# Patient Record
Sex: Female | Born: 1975 | Race: Black or African American | Hispanic: No | Marital: Single | Smoking: Never smoker
Health system: Southern US, Community
[De-identification: ages and names within clinical notes are randomized; demographics above are authoritative.]

## PROBLEM LIST (undated history)

## (undated) DIAGNOSIS — G4730 Sleep apnea, unspecified: Secondary | ICD-10-CM

## (undated) DIAGNOSIS — M1990 Unspecified osteoarthritis, unspecified site: Secondary | ICD-10-CM

## (undated) DIAGNOSIS — D649 Anemia, unspecified: Secondary | ICD-10-CM

## (undated) DIAGNOSIS — K219 Gastro-esophageal reflux disease without esophagitis: Secondary | ICD-10-CM

## (undated) HISTORY — DX: Unspecified osteoarthritis, unspecified site: M19.90

## (undated) HISTORY — DX: Gastro-esophageal reflux disease without esophagitis: K21.9

## (undated) HISTORY — PX: BUNIONECTOMY: SHX129

## (undated) HISTORY — PX: LAPAROSCOPIC GASTRIC BANDING: SHX1100

## (undated) HISTORY — DX: Anemia, unspecified: D64.9

## (undated) HISTORY — DX: Sleep apnea, unspecified: G47.30

---

## 2009-06-23 ENCOUNTER — Ambulatory Visit (HOSPITAL_COMMUNITY): Admission: RE | Admit: 2009-06-23 | Discharge: 2009-06-23 | Payer: Self-pay | Admitting: Surgery

## 2009-06-30 ENCOUNTER — Ambulatory Visit (HOSPITAL_COMMUNITY): Admission: RE | Admit: 2009-06-30 | Discharge: 2009-06-30 | Payer: Self-pay | Admitting: Surgery

## 2009-07-01 ENCOUNTER — Ambulatory Visit (HOSPITAL_BASED_OUTPATIENT_CLINIC_OR_DEPARTMENT_OTHER): Admission: RE | Admit: 2009-07-01 | Discharge: 2009-07-01 | Payer: Self-pay | Admitting: Surgery

## 2009-07-04 ENCOUNTER — Ambulatory Visit: Payer: Self-pay | Admitting: Internal Medicine

## 2009-07-04 DIAGNOSIS — G4730 Sleep apnea, unspecified: Secondary | ICD-10-CM

## 2009-07-04 HISTORY — DX: Sleep apnea, unspecified: G47.30

## 2009-08-03 ENCOUNTER — Encounter: Admission: RE | Admit: 2009-08-03 | Discharge: 2009-11-01 | Payer: Self-pay | Admitting: Surgery

## 2009-08-26 ENCOUNTER — Encounter: Payer: Self-pay | Admitting: Internal Medicine

## 2009-10-01 ENCOUNTER — Ambulatory Visit (HOSPITAL_COMMUNITY): Admission: RE | Admit: 2009-10-01 | Discharge: 2009-10-01 | Payer: Self-pay | Admitting: Surgery

## 2009-11-17 ENCOUNTER — Ambulatory Visit (HOSPITAL_COMMUNITY): Admission: RE | Admit: 2009-11-17 | Discharge: 2009-11-18 | Payer: Self-pay | Admitting: Surgery

## 2009-11-23 ENCOUNTER — Ambulatory Visit (HOSPITAL_COMMUNITY): Admission: RE | Admit: 2009-11-23 | Discharge: 2009-11-23 | Payer: Self-pay | Admitting: Surgery

## 2009-12-01 ENCOUNTER — Encounter: Admission: RE | Admit: 2009-12-01 | Discharge: 2010-03-01 | Payer: Self-pay | Admitting: Surgery

## 2010-01-25 ENCOUNTER — Other Ambulatory Visit: Admission: RE | Admit: 2010-01-25 | Discharge: 2010-01-25 | Payer: Self-pay | Admitting: Family Medicine

## 2010-07-12 ENCOUNTER — Encounter
Admission: RE | Admit: 2010-07-12 | Discharge: 2010-08-03 | Payer: Self-pay | Source: Home / Self Care | Attending: Surgery | Admitting: Surgery

## 2010-07-25 ENCOUNTER — Encounter: Payer: Self-pay | Admitting: Surgery

## 2010-08-03 NOTE — Miscellaneous (Signed)
Summary: PAP Device follow up report/[facility]  PAP Device follow up report/[facility]   Imported By: Sherian Rein 09/11/2009 13:12:51  _____________________________________________________________________  External Attachment:    Type:   Image     Comment:   External Document

## 2010-09-20 LAB — CBC
HCT: 36 % (ref 36.0–46.0)
MCHC: 32 g/dL (ref 30.0–36.0)
RBC: 4.12 MIL/uL (ref 3.87–5.11)

## 2010-09-20 LAB — DIFFERENTIAL
Basophils Relative: 0 % (ref 0–1)
Lymphocytes Relative: 5 % — ABNORMAL LOW (ref 12–46)
Lymphs Abs: 0.7 10*3/uL (ref 0.7–4.0)
Monocytes Absolute: 0.5 10*3/uL (ref 0.1–1.0)
Monocytes Relative: 4 % (ref 3–12)
Neutro Abs: 12.2 10*3/uL — ABNORMAL HIGH (ref 1.7–7.7)
Neutrophils Relative %: 91 % — ABNORMAL HIGH (ref 43–77)

## 2010-09-20 LAB — HEMOGLOBIN AND HEMATOCRIT, BLOOD: HCT: 38.2 % (ref 36.0–46.0)

## 2010-09-21 ENCOUNTER — Encounter: Payer: BC Managed Care – PPO | Attending: Surgery | Admitting: *Deleted

## 2010-09-21 DIAGNOSIS — Z713 Dietary counseling and surveillance: Secondary | ICD-10-CM | POA: Insufficient documentation

## 2010-09-21 DIAGNOSIS — Z9884 Bariatric surgery status: Secondary | ICD-10-CM | POA: Insufficient documentation

## 2010-09-21 DIAGNOSIS — Z09 Encounter for follow-up examination after completed treatment for conditions other than malignant neoplasm: Secondary | ICD-10-CM | POA: Insufficient documentation

## 2010-09-21 LAB — COMPREHENSIVE METABOLIC PANEL
AST: 33 U/L (ref 0–37)
CO2: 28 mEq/L (ref 19–32)
Calcium: 9.2 mg/dL (ref 8.4–10.5)
Chloride: 103 mEq/L (ref 96–112)
GFR calc Af Amer: >60 mL/min (ref >60)
Sodium: 139 mEq/L (ref 135–145)
Total Bilirubin: 0.5 mg/dL (ref 0.3–1.2)
Total Protein: 7.9 g/dL (ref 6.0–8.3)

## 2010-09-21 LAB — CBC
HCT: 36 % (ref 36.0–46.0)
Platelets: 280 10*3/uL (ref 150–400)
RBC: 4.21 MIL/uL (ref 3.87–5.11)
RDW: 14.4 % (ref 11.5–15.5)

## 2010-09-21 LAB — DIFFERENTIAL
Basophils Relative: 2 % — ABNORMAL HIGH (ref 0–1)
Lymphocytes Relative: 22 % (ref 12–46)
Monocytes Absolute: 0.8 10*3/uL (ref 0.1–1.0)

## 2010-11-18 ENCOUNTER — Ambulatory Visit: Payer: BC Managed Care – PPO | Admitting: *Deleted

## 2010-11-24 ENCOUNTER — Encounter: Payer: BC Managed Care – PPO | Attending: Surgery | Admitting: *Deleted

## 2010-11-24 DIAGNOSIS — Z9884 Bariatric surgery status: Secondary | ICD-10-CM | POA: Insufficient documentation

## 2010-11-24 DIAGNOSIS — Z713 Dietary counseling and surveillance: Secondary | ICD-10-CM | POA: Insufficient documentation

## 2010-11-24 DIAGNOSIS — Z09 Encounter for follow-up examination after completed treatment for conditions other than malignant neoplasm: Secondary | ICD-10-CM | POA: Insufficient documentation

## 2011-02-01 ENCOUNTER — Ambulatory Visit: Payer: BC Managed Care – PPO | Admitting: *Deleted

## 2011-06-03 ENCOUNTER — Ambulatory Visit (INDEPENDENT_AMBULATORY_CARE_PROVIDER_SITE_OTHER): Payer: BC Managed Care – PPO | Admitting: Surgery

## 2011-06-10 ENCOUNTER — Ambulatory Visit (INDEPENDENT_AMBULATORY_CARE_PROVIDER_SITE_OTHER): Payer: BC Managed Care – PPO | Admitting: Surgery

## 2011-06-10 ENCOUNTER — Encounter (INDEPENDENT_AMBULATORY_CARE_PROVIDER_SITE_OTHER): Payer: Self-pay | Admitting: Surgery

## 2011-06-10 VITALS — BP 106/68 | HR 60 | Temp 97.8°F | Resp 18 | Ht 71.75 in | Wt 335.0 lb

## 2011-06-10 DIAGNOSIS — Z9884 Bariatric surgery status: Secondary | ICD-10-CM

## 2011-06-10 DIAGNOSIS — Z4651 Encounter for fitting and adjustment of gastric lap band: Secondary | ICD-10-CM

## 2011-06-10 NOTE — Patient Instructions (Signed)

## 2011-06-10 NOTE — Progress Notes (Signed)
19 months postop with a weight of 335 she has lost 106 pounds. She may be removing to [facility].today I added one cc to APL Band. She was able to drink liquids without problems.  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 She is seeing Dr. Cyndia Skeeters.    Plan return in 2 months

## 2011-08-26 ENCOUNTER — Ambulatory Visit (INDEPENDENT_AMBULATORY_CARE_PROVIDER_SITE_OTHER): Payer: BC Managed Care – PPO | Admitting: Surgery

## 2011-08-26 DIAGNOSIS — Z9884 Bariatric surgery status: Secondary | ICD-10-CM

## 2011-08-26 NOTE — Progress Notes (Signed)
Kayla Russo 36 y.o.  There is no height or weight on file to calculate BMI.  Patient Active Problem List  Diagnoses  . 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 Amoxicillin     Rash around mouth     Past Surgical History  Procedure Date  . Laparoscopic gastric banding may 17th, 2011  . Bunionectomy 2007/2008   [name], MD, MD No diagnosis found.  Ever since the 06/10/11  fill when added 1 cc to her band Kayla has had reflux at night. She was clearly too tight. I went ahead and removed 0.5 cc from her band. She was able to drink much easier than. I'll see her back in 3 months. She is still contemplating moving to [facility]. Matt B. Daphine Deutscher, MD, [facility] [phone] beeper [phone]  08/26/2011 5:15 PM

## 2011-10-28 ENCOUNTER — Ambulatory Visit (INDEPENDENT_AMBULATORY_CARE_PROVIDER_SITE_OTHER): Payer: BC Managed Care – PPO | Admitting: Surgery

## 2011-10-28 ENCOUNTER — Encounter (INDEPENDENT_AMBULATORY_CARE_PROVIDER_SITE_OTHER): Payer: Self-pay | Admitting: Surgery

## 2011-10-28 VITALS — BP 122/80 | HR 76 | Temp 95.8°F | Resp 16 | Ht 71.75 in | Wt 340.8 lb

## 2011-10-28 DIAGNOSIS — Z9884 Bariatric surgery status: Secondary | ICD-10-CM

## 2011-10-28 NOTE — Progress Notes (Signed)
[place] comes in today and she is almost 2 years out from her lapband APL with repair of hiatal hernia. She has recently developed some acid reflux issues. She is currently down to 340.8 pounds me she lost 36% of her excess weight or 100.4 pounds.  She is currently in the midst of some conflict and tried to decide whether she can move to [place] or not. There may be some issues with her partner as well. We had long discussion about trying to get back on track and staying on the low carbohydrate diet. Also try to increase her activity level. She would like to be below 300 pounds.  I don't think she needs a fill today. I will see her back in 6-8 weeks and we'll discuss how she is doing.

## 2011-12-23 ENCOUNTER — Encounter (INDEPENDENT_AMBULATORY_CARE_PROVIDER_SITE_OTHER): Payer: BC Managed Care – PPO | Admitting: Surgery

## 2011-12-30 ENCOUNTER — Encounter (INDEPENDENT_AMBULATORY_CARE_PROVIDER_SITE_OTHER): Payer: Self-pay | Admitting: Surgery

## 2011-12-30 ENCOUNTER — Ambulatory Visit (INDEPENDENT_AMBULATORY_CARE_PROVIDER_SITE_OTHER): Payer: BC Managed Care – PPO | Admitting: Surgery

## 2011-12-30 VITALS — Ht 71.75 in | Wt 351.1 lb

## 2011-12-30 DIAGNOSIS — K219 Gastro-esophageal reflux disease without esophagitis: Secondary | ICD-10-CM

## 2011-12-30 NOTE — Progress Notes (Signed)
Kayla Russo 36 y.o.  Body mass index is 47.95 kg/(m^2).  Patient Active Problem List  Diagnosis  . 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 Amoxicillin     Rash around mouth     Past Surgical History  Procedure Date  . Laparoscopic gastric banding may 17th, 2011  . Bunionectomy 2007/2008   [name], MD No diagnosis found.  Kayla has gained 20 pounds since February and 11 pounds since I saw her in April. She had 5/10 of a cc removed in February because she was having reflux. I did a APL with one stitch posterior hiatal hernia closure and now it sounds like she may be failing that repair. Before we do anything along to get an upper GI series to look at her anatomy. She will not be moving to [facility] she will be around for [place] to care for. Will obtain the upper GI and see her back in the office. Matt B. Daphine Deutscher, MD, [facility] [phone] beeper [phone]  12/30/2011 10:09 AM

## 2011-12-30 NOTE — Patient Instructions (Addendum)
Have upper GI series and then followup with our office

## 2012-01-04 ENCOUNTER — Ambulatory Visit
Admission: RE | Admit: 2012-01-04 | Discharge: 2012-01-04 | Disposition: A | Discharge status: Home / Self Care | Payer: BC Managed Care – PPO | Source: Ambulatory Visit | Attending: Surgery | Admitting: Surgery

## 2012-01-04 ENCOUNTER — Encounter (INDEPENDENT_AMBULATORY_CARE_PROVIDER_SITE_OTHER): Payer: Self-pay | Admitting: Surgery

## 2012-01-04 ENCOUNTER — Other Ambulatory Visit (INDEPENDENT_AMBULATORY_CARE_PROVIDER_SITE_OTHER): Payer: Self-pay | Admitting: Surgery

## 2012-01-04 ENCOUNTER — Ambulatory Visit (INDEPENDENT_AMBULATORY_CARE_PROVIDER_SITE_OTHER): Payer: BC Managed Care – PPO | Admitting: Surgery

## 2012-01-04 VITALS — BP 142/78 | HR 68 | Temp 97.7°F | Resp 16 | Ht 72.0 in | Wt 350.2 lb

## 2012-01-04 DIAGNOSIS — K219 Gastro-esophageal reflux disease without esophagitis: Secondary | ICD-10-CM

## 2012-01-04 DIAGNOSIS — Z9884 Bariatric surgery status: Secondary | ICD-10-CM

## 2012-01-04 NOTE — Progress Notes (Signed)
[name] 36 y.o.  Body mass index is 47.50 kg/(m^2).  Patient Active Problem List  Diagnosis  . 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 Amoxicillin     Rash around mouth     Past Surgical History  Procedure Date  . Laparoscopic gastric banding may 17th, 2011  . Bunionectomy 2007/2008   [name], MD No diagnosis found.  UGI series reviewed.  Band in good position but only a trickle of contrast goes through the band.  Too tight.  0.5 cc removed.  I met with her and her partner and discuss the things to watch for her diet. We discussed a low carb diet as I suspect she's been developing maladaptive eating. Hopefully she will tolerate more than good things. I will see her again in August. Will see back in 4-5 weeks  Matt B. Daphine Deutscher, MD, [facility] [phone] beeper [phone]  01/04/2012 5:54 PM

## 2012-01-04 NOTE — Patient Instructions (Addendum)

## 2012-01-20 ENCOUNTER — Encounter: Payer: BC Managed Care – PPO | Attending: Surgery | Admitting: *Deleted

## 2012-01-20 DIAGNOSIS — Z713 Dietary counseling and surveillance: Secondary | ICD-10-CM | POA: Insufficient documentation

## 2012-01-20 DIAGNOSIS — Z9884 Bariatric surgery status: Secondary | ICD-10-CM | POA: Insufficient documentation

## 2012-01-20 DIAGNOSIS — Z09 Encounter for follow-up examination after completed treatment for conditions other than malignant neoplasm: Secondary | ICD-10-CM | POA: Insufficient documentation

## 2012-01-20 NOTE — Progress Notes (Signed)
  Follow-up visit:  27 Months Post-Operative LAGB Surgery  Medical Nutrition Therapy:  Appt start time: 0815 end time:  0900.  Primary concerns today:  Post-operative Bariatric Surgery Nutrition Management. Kayla Russo today for f/u after hiatus from [facility]: [name] Reports ABW of ~320-325 lbs until January 2013, when she started gaining wt d/t increased stress; currently 352.7 lbs (gain of 27.7 lbs in last 7 mos). Food recall reveals excessive CHO intake and she has stopped exercising. Had 0.5 cc removed from band on 01/04/12 and states she is in the "green" zone, though needs help making better food choices. Discussed getting back on track with only 15g of CHO per meal/snack and resuming exercise.     Surgery date: 11/17/09 Start weight at [facility]: 445.2 lbs (per pt)  Weight today: 352.7 lbs (reports lowest wt of 318 lbs in 07/2011 & ABW of 320-325 lbs) Weight change: n/a Total weight lost: 92.5 lbs BMI: 50.6 kg/m^2  24-hr recall: B (AM): reg sausage - 1 pc, 2 eggs w/ cheese, pancakes - 2 @ 4" ea Snk (AM): [place] sandwich w/ mayo & cheese  L (PM): None Snk (PM): Chips OR none  D (9-10 PM): 1/2 Chipotle chicken bowl, brown rice, ice cream sandwich  Snk (PM): None  Fluid intake: Water (sometimes w/ SF flavored packets), 12 oz Pepsi (1x/week) = > 64 oz Estimated total protein intake: 70-80 g  Medications: None Supplementation: Takes calcium 2-3 times/day; takes MVI regularly.  Using straws: No Drinking while eating: Sips Hair loss: No Carbonated beverages: Yes N/V/D/C: Sometimes - all Last Lap-Band fill: 0.5 cc removed 01/04/12  Recent physical activity:  Just rejoined gym w/ goal of 30-45 min 3-4 times/week; walks puppy - has not been consistently active for last 3 mos.  Progress Towards Goal(s):  In progress.  Handouts given during visit include:  Phase IV: Low Carb + Low Fat   Nutritional Diagnosis:  [name]-3.3 Overweight/obesity related to poor food choices as evidenced by patient reported  food recall and weight gain of ~28 lbs in the past 7 months.    Intervention:  Nutrition education/reinforcement.  Monitoring/Evaluation:  Dietary intake, exercise, lap band fills, and body weight. Follow up in 1 month.

## 2012-01-20 NOTE — Patient Instructions (Addendum)
Goals:  Follow Phase IV: Low Carb & Low Fat   Eat 3-6 small meals/snacks, every 3-5 hrs  Increase lean protein foods to meet 60-80g goal  Increase fluid intake to 64oz +  Add 15 grams of carbohydrate (fruit, whole grain, starchy vegetable) with meals  Avoid drinking 15 minutes before, during and 30 minutes after eating  Aim for >30 min of physical activity daily

## 2012-01-21 ENCOUNTER — Encounter: Payer: Self-pay | Admitting: *Deleted

## 2012-01-30 ENCOUNTER — Other Ambulatory Visit: Payer: Self-pay | Admitting: Family Medicine

## 2012-01-30 ENCOUNTER — Other Ambulatory Visit (HOSPITAL_COMMUNITY)
Admission: RE | Admit: 2012-01-30 | Discharge: 2012-01-30 | Disposition: A | Discharge status: Home / Self Care | Payer: BC Managed Care – PPO | Source: Ambulatory Visit | Attending: Family Medicine | Admitting: Family Medicine

## 2012-01-30 DIAGNOSIS — Z124 Encounter for screening for malignant neoplasm of cervix: Secondary | ICD-10-CM | POA: Insufficient documentation

## 2012-01-30 DIAGNOSIS — Z113 Encounter for screening for infections with a predominantly sexual mode of transmission: Secondary | ICD-10-CM | POA: Insufficient documentation

## 2012-02-14 ENCOUNTER — Encounter (INDEPENDENT_AMBULATORY_CARE_PROVIDER_SITE_OTHER): Payer: BC Managed Care – PPO | Admitting: Surgery

## 2012-03-16 ENCOUNTER — Ambulatory Visit (INDEPENDENT_AMBULATORY_CARE_PROVIDER_SITE_OTHER): Payer: BC Managed Care – PPO | Admitting: Surgery

## 2012-03-16 ENCOUNTER — Encounter (INDEPENDENT_AMBULATORY_CARE_PROVIDER_SITE_OTHER): Payer: Self-pay | Admitting: Surgery

## 2012-03-16 DIAGNOSIS — G4730 Sleep apnea, unspecified: Secondary | ICD-10-CM

## 2012-03-16 DIAGNOSIS — F509 Eating disorder, unspecified: Secondary | ICD-10-CM

## 2012-03-16 NOTE — Progress Notes (Signed)
Kayla Russo Body mass index is 49.52 kg/(m^2).  Having regurgitation:  no  Nocturnal reflux?  no  Amount of fill  0  Kayla is restrict somewhat but more importantly she is eating the wrong things like drinking milkshakes and eating fries.  This is partly the work of her partner who tempts her with such things.   She mainly came today to get her CPAPmachine refitted.  She is waking up in the night and is worried that the settings may be wrong.    Will get her back to see Mardelle Matte in 8 weeks.  She's got to decide what she wants to do.  She knows what to eat.

## 2012-03-16 NOTE — Patient Instructions (Addendum)
We will call the sleep center on Monday and provide that referral.    Calorie Counting Diet A calorie counting diet requires you to eat the number of calories that are right for you in a day. Calories are the measurement of how much energy you get from the food you eat. Eating the right amount of calories is important for staying at a healthy weight. If you eat too many calories, your body will store them as fat and you may gain weight. If you eat too few calories, you may lose weight. Counting the number of calories you eat during a day will help you know if you are eating the right amount. A Registered Dietitian can determine how many calories you need in a day. The amount of calories needed varies from person to person. If your goal is to lose weight, you will need to eat fewer calories. Losing weight can benefit you if you are overweight or have health problems such as heart disease, high blood pressure, or diabetes. If your goal is to gain weight, you will need to eat more calories. Gaining weight may be necessary if you have a certain health problem that causes your body to need more energy. TIPS Whether you are increasing or decreasing the number of calories you eat during a day, it may be hard to get used to changes in what you eat and drink. The following are tips to help you keep track of the number of calories you eat.  Measure foods at home with measuring cups. This helps you know the amount of food and number of calories you are eating.   Restaurants often serve food in amounts that are larger than 1 serving. While eating out, estimate how many servings of a food you are given. For example, a serving of cooked rice is  cup or about the size of half of a fist. Knowing serving sizes will help you be aware of how much food you are eating at restaurants.   Ask for smaller portion sizes or child-size portions at restaurants.   Plan to eat half of a meal at a restaurant. Take the rest home or  share the other half with a friend.   Read the Nutrition Facts panel on food labels for calorie content and serving size. You can find out how many servings are in a package, the size of a serving, and the number of calories each serving has.   For example, a package might contain 3 cookies. The Nutrition Facts panel on that package says that 1 serving is 1 cookie. Below that, it will say there are 3 servings in the container. The calories section of the Nutrition Facts label says there are 90 calories. This means there are 90 calories in 1 cookie (1 serving). If you eat 1 cookie you have eaten 90 calories. If you eat all 3 cookies, you have eaten 270 calories (3 servings x 90 calories = 270 calories).  The list below tells you how big or small some common portion sizes are.  1 oz.........4 stacked dice.   3 oz........Marland KitchenDeck of cards.   1 tsp.......Marland KitchenTip of little finger.   1 tbs......Marland KitchenMarland KitchenThumb.   2 tbs.......Marland KitchenGolf ball.    cup......Marland KitchenHalf of a fist.   1 cup.......Marland KitchenA fist.  KEEP A FOOD LOG Write down every food item you eat, the amount you eat, and the number of calories in each food you eat during the day. At the end of the day, you can add  up the total number of calories you have eaten. It may help to keep a list like the one below. Find out the calorie information by reading the Nutrition Facts panel on food labels. Breakfast  Bran cereal (1 cup, 110 calories).   Fat-free milk ( cup, 45 calories).  Snack  Apple (1 medium, 80 calories).  Lunch  Spinach (1 cup, 20 calories).   Tomato ( medium, 20 calories).   Chicken breast strips (3 oz, 165 calories).   Shredded cheddar cheese ( cup, 110 calories).   Light [place] dressing (2 tbs, 60 calories).   Whole-wheat bread (1 slice, 80 calories).   Tub margarine (1 tsp, 35 calories).   Vegetable soup (1 cup, 160 calories).  Dinner  Pork chop (3 oz, 190 calories).   Brown rice (1 cup, 215 calories).   Steamed broccoli (  cup, 20 calories).   Strawberries (1  cup, 65 calories).   Whipped cream (1 tbs, 50 calories).  Daily Calorie Total: 1425 Document Released: 06/20/2005 Document Revised: 06/09/2011 Document Reviewed: 12/15/2006 [facility] Patient Information 2012 [place], [place].

## 2012-04-11 ENCOUNTER — Encounter: Payer: Self-pay | Admitting: Pulmonary Disease

## 2012-04-11 ENCOUNTER — Ambulatory Visit (INDEPENDENT_AMBULATORY_CARE_PROVIDER_SITE_OTHER): Payer: BC Managed Care – PPO | Admitting: Pulmonary Disease

## 2012-04-11 VITALS — BP 104/64 | HR 71 | Ht 72.0 in | Wt 366.0 lb

## 2012-04-11 DIAGNOSIS — G4733 Obstructive sleep apnea (adult) (pediatric): Secondary | ICD-10-CM | POA: Insufficient documentation

## 2012-04-11 NOTE — Assessment & Plan Note (Signed)
The patient has a history of severe obstructive sleep apnea, and has responded well to bilevel in the past.  She is now having issues with increased awakenings and nonrestorative sleep.  She is also having increased sleepiness during the day.  Her download shows adequate control of her sleep apnea, and no significant mask leaks.  However, she only has a 47% compliance rate greater than 4 hours over the last 6 months.  The patient is due for a new CPAP mask, and may ultimately need re\re optimization of her pressure.  However, I have stressed to her the importance of wearing the device at least 6 hours each night if possible.  I've also encouraged her to work aggressively on weight loss.

## 2012-04-11 NOTE — Progress Notes (Signed)
Subjective:    Patient ID: Kayla Russo, female    DOB: Jan 19, 1976, 36 y.o.   MRN: [id number]  HPI The patient is a 36 year old female who I have been asked to see for management of obstructive sleep apnea.  She was diagnosed with very severe sleep apnea in 2010, with an AHI of 92 events per hour.  She was intolerant of CPAP, and ultimately started on bilevel at 13/9.  She has had lap band surgery, and her weight has gone from 441 pounds to 366 today.  However, the patient states that she has gained 40 pounds over the last 9 months.  She has been wearing bilevel, but her download today shows only a 47% compliance at 4 hours or greater over the last 6 months.  She does not have any significant mask leak, and her AHI appears to be controlled by the download.  The patient has not had any followup since starting on bilevel, and in the last 5-6 months she has felt that she is not sleeping as well.  She has had increased awakenings at night, and does not feel as rested in the mornings upon arising.  Her mask is currently about a year-old, and she uses a full face mask.  She now has sleep pressure with periods of inactivity, and some sleepiness with driving longer distances.  Her Epworth score today is borderline at 10.  Sleep Questionnaire: What time do you typically go to bed?( Between what hours) 1130pm How long does it take you to fall asleep? 15-20 min How many times during the night do you wake up? 3 What time do you get out of bed to start your day? 0630 Do you drive or operate heavy machinery in your occupation? No How much has your weight changed (up or down) over the past two years? (In pounds) Have you ever had a sleep study before? Yes If yes, location of study? [facility] If yes, date of study? 2011 Do you currently use CPAP? Yes If so, what pressure? Do you wear oxygen at any time? No    Review of Systems  Constitutional: Negative for fever and unexpected weight change.  HENT:  Negative for ear pain, nosebleeds, congestion, sore throat, rhinorrhea, sneezing, trouble swallowing, dental problem, postnasal drip and sinus pressure.   Eyes: Negative for redness and itching.  Respiratory: Negative for cough, chest tightness, shortness of breath and wheezing.   Cardiovascular: Negative for palpitations and leg swelling.  Gastrointestinal: Negative for nausea and vomiting.  Genitourinary: Negative for dysuria.  Musculoskeletal: Negative for joint swelling.  Skin: Negative for rash.  Neurological: Negative for headaches.  Hematological: Does not bruise/bleed easily.  Psychiatric/Behavioral: Positive for disturbed wake/sleep cycle. Negative for dysphoric mood. The patient is not nervous/anxious.        Objective:   Physical Exam Constitutional:  Obese female, no acute distress  HENT:  Nares patent without discharge, large turbinates  Oropharynx without exudate, palate and uvula are elongated.  Eyes:  Perrla, eomi, no scleral icterus  Neck:  No JVD, no TMG  Cardiovascular:  Normal rate, regular rhythm, no rubs or gallops.  No murmurs        Intact distal pulses  Pulmonary :  Normal breath sounds, no stridor or respiratory distress   No rales, rhonchi, or wheezing  Abdominal:  Soft, nondistended, bowel sounds present.  No tenderness noted.   Musculoskeletal:  mild lower extremity edema noted.  Lymph Nodes:  No cervical lymphadenopathy noted  Skin:  No cyanosis noted  Neurologic:  Alert, appropriate, moves all 4 extremities without obvious deficit.         Assessment & Plan:

## 2012-04-11 NOTE — Patient Instructions (Addendum)
You need to get a replacement mask, then let us know if you continue to have the same issue. If you do, will re-optimize your bipap pressure. Work on weight loss.  Make an apptm to see me back in one year, but we will continue to troubleshoot your sleep apnea issues.

## 2012-05-10 ENCOUNTER — Encounter (INDEPENDENT_AMBULATORY_CARE_PROVIDER_SITE_OTHER): Payer: BC Managed Care – PPO

## 2012-06-07 ENCOUNTER — Encounter (INDEPENDENT_AMBULATORY_CARE_PROVIDER_SITE_OTHER): Payer: BC Managed Care – PPO

## 2012-08-02 ENCOUNTER — Encounter (INDEPENDENT_AMBULATORY_CARE_PROVIDER_SITE_OTHER): Payer: BC Managed Care – PPO

## 2013-04-12 ENCOUNTER — Ambulatory Visit (INDEPENDENT_AMBULATORY_CARE_PROVIDER_SITE_OTHER): Payer: BC Managed Care – PPO | Admitting: Pulmonary Disease

## 2013-04-12 ENCOUNTER — Encounter: Payer: Self-pay | Admitting: Pulmonary Disease

## 2013-04-12 VITALS — BP 128/88 | HR 83 | Temp 98.5°F | Ht 72.0 in | Wt 389.2 lb

## 2013-04-12 DIAGNOSIS — G4733 Obstructive sleep apnea (adult) (pediatric): Secondary | ICD-10-CM

## 2013-04-12 NOTE — Progress Notes (Signed)
  Subjective:    Patient ID: Kayla Russo, female    DOB: [birth year], 37 y.o.   MRN: [id number]  HPI The patient comes in today for followup of her known obstructive sleep apnea.  She is wearing bilevel compliantly, and is having no issues with her mask fit or pressure.  She is sleeping adequately, and is mostly satisfied with her daytime alertness.  She has not kept up with her mask cushion changes, nor her filters or tubing.  Of note, she has gained 23 pounds since her last visit.    Review of Systems  Constitutional: Negative for fever and unexpected weight change.  HENT: Negative for congestion, dental problem, ear pain, nosebleeds, postnasal drip, rhinorrhea, sinus pressure, sneezing, sore throat and trouble swallowing.   Eyes: Negative for redness and itching.  Respiratory: Negative for cough, chest tightness, shortness of breath and wheezing.   Cardiovascular: Negative for palpitations and leg swelling.  Gastrointestinal: Negative for nausea and vomiting.  Genitourinary: Negative for dysuria.  Musculoskeletal: Negative for joint swelling.  Skin: Negative for rash.  Neurological: Negative for headaches.  Hematological: Does not bruise/bleed easily.  Psychiatric/Behavioral: Positive for sleep disturbance ( frequent awakenings). Negative for dysphoric mood. The patient is not nervous/anxious.        Objective:   Physical Exam Morbidly obese female in no acute distress Nose without purulence or discharge noted No skin breakdown or pressure necrosis from CPAP mask Neck without lymphadenopathy or thyromegaly Lower extremities have mild edema, cyanosis Alert and oriented, does not appear to be sleepy, moves all 4 extremities       Assessment & Plan:

## 2013-04-12 NOTE — Assessment & Plan Note (Signed)
The pt is wearing her bilevel compliantly, but has not kept up with cushion replacements on her mask.  I have stressed to her the need to do this in order to maintain an adequate seal.  I have also encouraged her to work aggressively on weight loss. She will followup with me in one year if doing well.

## 2013-04-12 NOTE — Patient Instructions (Signed)
Keep up with mask cushions and supplies. Work on weight loss followup with me in one year, and call if having tolerance issues.

## 2013-08-02 ENCOUNTER — Ambulatory Visit (INDEPENDENT_AMBULATORY_CARE_PROVIDER_SITE_OTHER): Payer: BC Managed Care – PPO | Admitting: Surgery

## 2013-08-07 ENCOUNTER — Telehealth (INDEPENDENT_AMBULATORY_CARE_PROVIDER_SITE_OTHER): Payer: Self-pay | Admitting: *Deleted

## 2013-08-07 NOTE — Telephone Encounter (Signed)
Patient called to ask when her appt that was cancelled on Friday will be rescheduled for.  Explained that at this time I do not have an answer to this.  Patient is very irritated and wants to be seen as soon as possible.  I stated that I understand this and we working to try to get her rescheduled.  Patient states understanding and states she will be calling everyday to ask about her new appt date and time.

## 2013-08-16 NOTE — Telephone Encounter (Signed)
Appointment scheduled for 08/16/13 w/Dr. Andrey CampanileWilson for lap band fluid removal

## 2013-08-30 ENCOUNTER — Encounter (INDEPENDENT_AMBULATORY_CARE_PROVIDER_SITE_OTHER): Payer: BC Managed Care – PPO | Admitting: Surgery

## 2014-04-11 ENCOUNTER — Ambulatory Visit: Payer: BC Managed Care – PPO | Admitting: Pulmonary Disease

## 2014-04-25 ENCOUNTER — Ambulatory Visit: Payer: BC Managed Care – PPO | Admitting: Pulmonary Disease

## 2014-04-28 ENCOUNTER — Ambulatory Visit: Payer: BC Managed Care – PPO | Admitting: Pulmonary Disease

## 2014-05-08 ENCOUNTER — Ambulatory Visit: Payer: BC Managed Care – PPO | Admitting: Pulmonary Disease

## 2014-05-20 ENCOUNTER — Ambulatory Visit (INDEPENDENT_AMBULATORY_CARE_PROVIDER_SITE_OTHER): Payer: BC Managed Care – PPO | Admitting: Pulmonary Disease

## 2014-05-20 ENCOUNTER — Encounter: Payer: Self-pay | Admitting: Pulmonary Disease

## 2014-05-20 VITALS — BP 120/62 | HR 68 | Temp 98.1°F | Ht 72.0 in | Wt 363.2 lb

## 2014-05-20 DIAGNOSIS — G4733 Obstructive sleep apnea (adult) (pediatric): Secondary | ICD-10-CM

## 2014-05-20 NOTE — Assessment & Plan Note (Signed)
The patient is doing very well on her bilevel device, and her downloaded shows excellent control of her AHI. She is having some mask leak issues, and she tells me that she is overdue for a new cushion. Perhaps this is the reason for her awakening related to mask leak. She has lost 26 pounds since the last visit, and I have congratulated her on this.

## 2014-05-20 NOTE — Progress Notes (Signed)
   Subjective:    Patient ID: Kayla Russo, female    DOB: 05/26/1976, 38 y.o.   MRN: [id number]  HPI The patient comes in today for follow-up of her known severe obstructive sleep apnea. She is wearing bilevel compliantly by her download, and her AHI is well controlled. She is having excessive mask leak, and perhaps this is the reason for her awakening at night. Of note, the patient has lost 26 pounds since last visit. She is very happy with her daytime alertness as long as she wears her device.   Review of Systems  Constitutional: Negative for fever and unexpected weight change.  HENT: Negative for congestion, dental problem, ear pain, nosebleeds, postnasal drip, rhinorrhea, sinus pressure, sneezing, sore throat and trouble swallowing.   Eyes: Negative for redness and itching.  Respiratory: Negative for cough, chest tightness, shortness of breath and wheezing.   Cardiovascular: Negative for palpitations and leg swelling.  Gastrointestinal: Negative for nausea and vomiting.  Genitourinary: Negative for dysuria.  Musculoskeletal: Negative for joint swelling.  Skin: Negative for rash.  Neurological: Negative for headaches.  Hematological: Does not bruise/bleed easily.  Psychiatric/Behavioral: Negative for dysphoric mood. The patient is not nervous/anxious.        Objective:   Physical Exam Obese female in no acute distress Nose without purulence or discharge noted No skin breakdown or pressure necrosis from the C Pap mask Neck without lymphadenopathy or thyromegaly Lower extremities with mild edema, no cyanosis Alert and oriented, moves all 4 extremities.       Assessment & Plan:

## 2014-05-20 NOTE — Patient Instructions (Signed)
Continue on cpap, your apnea is well controlled currently Keep up with mask cushion changes on a regular basis Keep working on weight loss.  You are doing great.  followup with me again in one year, but please call if any questions or concerns.

## 2014-06-12 ENCOUNTER — Other Ambulatory Visit (INDEPENDENT_AMBULATORY_CARE_PROVIDER_SITE_OTHER): Payer: Self-pay | Admitting: Surgery

## 2014-06-16 ENCOUNTER — Ambulatory Visit
Admission: RE | Admit: 2014-06-16 | Discharge: 2014-06-16 | Disposition: A | Discharge status: Home / Self Care | Payer: BC Managed Care – PPO | Source: Ambulatory Visit | Attending: Surgery | Admitting: Surgery

## 2015-02-16 ENCOUNTER — Other Ambulatory Visit (HOSPITAL_COMMUNITY)
Admission: RE | Admit: 2015-02-16 | Discharge: 2015-02-16 | Disposition: A | Discharge status: Home / Self Care | Payer: BC Managed Care – PPO | Source: Ambulatory Visit | Attending: Family Medicine | Admitting: Family Medicine

## 2015-02-16 ENCOUNTER — Other Ambulatory Visit: Payer: Self-pay | Admitting: Family Medicine

## 2015-02-16 DIAGNOSIS — Z113 Encounter for screening for infections with a predominantly sexual mode of transmission: Secondary | ICD-10-CM | POA: Diagnosis present

## 2015-02-16 DIAGNOSIS — Z01411 Encounter for gynecological examination (general) (routine) with abnormal findings: Secondary | ICD-10-CM | POA: Insufficient documentation

## 2015-02-16 DIAGNOSIS — N760 Acute vaginitis: Secondary | ICD-10-CM | POA: Diagnosis present

## 2015-02-18 LAB — CYTOLOGY - PAP

## 2015-03-06 ENCOUNTER — Other Ambulatory Visit: Payer: Self-pay | Admitting: Surgery

## 2015-03-06 DIAGNOSIS — Z9884 Bariatric surgery status: Secondary | ICD-10-CM

## 2015-03-06 DIAGNOSIS — K219 Gastro-esophageal reflux disease without esophagitis: Secondary | ICD-10-CM

## 2015-03-12 ENCOUNTER — Ambulatory Visit
Admission: RE | Admit: 2015-03-12 | Discharge: 2015-03-12 | Disposition: A | Discharge status: Home / Self Care | Payer: BC Managed Care – PPO | Source: Ambulatory Visit | Attending: Surgery | Admitting: Surgery

## 2015-05-13 SURGERY — Surgical Case
Anesthesia: *Unknown

## 2015-05-22 ENCOUNTER — Encounter (HOSPITAL_COMMUNITY): Payer: Self-pay

## 2015-05-22 ENCOUNTER — Ambulatory Visit (HOSPITAL_COMMUNITY): Admit: 2015-05-22 | Payer: BC Managed Care – PPO | Admitting: Surgery

## 2015-05-22 ENCOUNTER — Ambulatory Visit: Payer: BC Managed Care – PPO | Admitting: Pulmonary Disease

## 2015-05-22 SURGERY — ESOPHAGOGASTRODUODENOSCOPY (EGD) WITH PROPOFOL
Anesthesia: Monitor Anesthesia Care

## 2015-08-21 ENCOUNTER — Encounter: Payer: Self-pay | Admitting: Pulmonary Disease

## 2015-08-21 ENCOUNTER — Ambulatory Visit (INDEPENDENT_AMBULATORY_CARE_PROVIDER_SITE_OTHER): Payer: BC Managed Care – PPO | Admitting: Pulmonary Disease

## 2015-08-21 VITALS — BP 126/60 | HR 81 | Ht 71.5 in | Wt 321.0 lb

## 2015-08-21 DIAGNOSIS — G4733 Obstructive sleep apnea (adult) (pediatric): Secondary | ICD-10-CM | POA: Diagnosis not present

## 2015-08-21 NOTE — Patient Instructions (Signed)
Will arrange for new BiPAP machine  Call if you feel pressure setting is too high  Follow up in 1 year

## 2015-08-21 NOTE — Progress Notes (Signed)
Current Outpatient Prescriptions on File Prior to Visit  Medication Sig  . albuterol (PROVENTIL HFA;VENTOLIN HFA) 108 (90 BASE) MCG/ACT inhaler Inhale 2 puffs into the lungs every 6 (six) hours as needed for wheezing.  . Calcium Citrate-Vitamin D (CALCIUM CITRATE + PO) Take 1,500 mg by mouth daily.    . Multiple Vitamins-Minerals (MULTIVITAMIN PO) Take by mouth daily.     No current facility-administered medications on file prior to visit.     Chief Complaint  Patient presents with  . Follow-up    former [facility] pt. pt. states she wears BIPAP 5hr everynight. feels pressure may be too much. supplies needed. DME:[facility]       Tests PSG 2010 >> AHI 92 BiPAP 07/21/15 to 08/20/15 >> used on 25 of 31 nights with average 5 hrs 16 min.  Average AHI 4.5 with BiPAP 13/9 cm H2O  Past medical hx Asthma, GERD, Anemia, Arthritis  Past surgical hx, Allergies, Family hx, Social hx all reviewed.  Vital Signs BP 126/60 mmHg  Pulse 81  Ht 5' 11.5" (1.816 m)  Wt 321 lb (145.605 kg)  BMI 44.15 kg/m2  SpO2 100%  History of Present Illness Kayla Russo is a 40 y.o. female with OSA.  She has been using BiPAP.  She had gastric bypass revision about 1 month ago.  Since then she has been getting mouth dryness.  She tried turning temp on humidifier down, and this helped some.  Her current machine is about 39 yrs old.   Physical Exam  General - No distress ENT - No sinus tenderness, no oral exudate, no LAN, MP 2, low laying soft palate Cardiac - s1s2 regular, no murmur Chest - No wheeze/rales/dullness Back - No focal tenderness Abd - Soft, non-tender Ext - No edema Neuro - Normal strength Skin - No rashes Psych - normal mood, and behavior   Assessment/Plan  Obstructive sleep apnea. Plan: - will arrange for new BiPAP machine >> continue BiPAP 13/9 cm H2O for now - she will call if mouth dryness persists >> will then decrease her pressure to 12/8 cm H2O    Patient Instructions  Will arrange  for new BiPAP machine  Call if you feel pressure setting is too high  Follow up in 1 year     Coralyn Helling, MD [place] [facility]/Sleep Pager:  [phone]

## 2016-09-22 ENCOUNTER — Ambulatory Visit: Payer: BC Managed Care – PPO | Admitting: Pulmonary Disease

## 2016-10-12 ENCOUNTER — Encounter: Payer: Self-pay | Admitting: Pulmonary Disease

## 2016-10-12 ENCOUNTER — Ambulatory Visit (INDEPENDENT_AMBULATORY_CARE_PROVIDER_SITE_OTHER): Payer: BC Managed Care – PPO | Admitting: Pulmonary Disease

## 2016-10-12 VITALS — BP 128/76 | HR 81 | Ht 71.5 in | Wt 320.0 lb

## 2016-10-12 DIAGNOSIS — G4733 Obstructive sleep apnea (adult) (pediatric): Secondary | ICD-10-CM

## 2016-10-12 NOTE — Progress Notes (Addendum)
Current Outpatient Prescriptions on File Prior to Visit  Medication Sig  . albuterol (PROVENTIL HFA;VENTOLIN HFA) 108 (90 BASE) MCG/ACT inhaler Inhale 2 puffs into the lungs every 6 (six) hours as needed for wheezing.  . Biotin 1 MG CAPS Take by mouth.  . Calcium Citrate-Vitamin D (CALCIUM CITRATE + PO) Take 3,000 mg by mouth daily.   . Multiple Vitamins-Minerals (MULTIVITAMIN PO) Take 2 tablets by mouth daily.   . pantoprazole (PROTONIX) 40 MG tablet Take by mouth.   No current facility-administered medications on file prior to visit.      Chief Complaint  Patient presents with  . Follow-up    Pt using Bipap nightly. Pt states that she is not sleeping well and is waking multiple times throughout the night. Pt requests a new machine. DME: [facility]     Sleep tests PSG 2010 >> AHI 92 BiPAP 07/21/15 to 08/20/15 >> used on 25 of 31 nights with average 5 hrs 16 min.  Average AHI 4.5 with BiPAP 13/9 cm H2O  Past medical history Asthma, GERD, Anemia, Arthritis  Past surgical history, Family history, Social history, Allergies  Vital Signs BP 128/76 (BP Location: Left Arm, Cuff Size: Normal)   Pulse 81   Ht 5' 11.5" (1.816 m)   Wt (!) 320 lb (145.2 kg)   SpO2 99%   BMI 44.01 kg/m   History of Present Illness Kayla Russo is a 41 y.o. female with OSA.  She had revision of her bariatric surgery in December.  She has lost about 100 lbs since her original PAP set up.  Her machine is more than 40 yrs old.  She doesn't feel like it is working the way it should.  She is getting episodes of snoring and gasping.  Physical Exam  General - pleasant Eyes - pupils reactive ENT - no sinus tenderness, MP 3, no oral exudate, no LAN Cardiac - regular, no murmur Chest - no wheeze Back - no tenderness Abd - soft, non tender Ext - no edema Neuro - normal strength Skin - no rashes Psych - normal mood   Assessment/Plan  Obstructive sleep apnea. - her weight has changed significantly since  original set up - her machine is old, and it is questionable whether it is working appropriately - she has been on Bipap, but my suspicion is that CPAP should be sufficient for her - will arrange for home sleep study to assess current status of sleep apnea - will then determine if she is a candidate for auto CPAP, or if she needs in lab titration study  Morbid obesity. - encouraged her to keep up with her weight loss efforts   Patient Instructions  Will arrange for home sleep study Will call to arrange for follow up after sleep study reviewed   Time spent 26 minutes face to face time.  Coralyn Helling, MD [place] [facility]/Sleep Pager:  [phone] 10/12/2016, 5:31 PM

## 2016-10-12 NOTE — Patient Instructions (Signed)
Will arrange for home sleep study Will call to arrange for follow up after sleep study reviewed  

## 2016-11-01 DIAGNOSIS — G4733 Obstructive sleep apnea (adult) (pediatric): Secondary | ICD-10-CM | POA: Diagnosis not present

## 2016-11-08 ENCOUNTER — Other Ambulatory Visit: Payer: Self-pay | Admitting: *Deleted

## 2016-11-08 ENCOUNTER — Telehealth: Payer: Self-pay | Admitting: Pulmonary Disease

## 2016-11-08 DIAGNOSIS — G4733 Obstructive sleep apnea (adult) (pediatric): Secondary | ICD-10-CM

## 2016-11-08 NOTE — Telephone Encounter (Signed)
HST 11/01/16 >> AHI 6.4, SaO2 low 93%   Will have my nurse inform pt that sleep study shows mild sleep apnea.  Options are 1) CPAP now, 2) ROV first.  If She is agreeable to CPAP, then please send order for auto CPAP range 5 to 15 cm H2O with heated humidity and mask of choice.  Have download sent 1 month after starting CPAP and set up ROV 2 months after starting CPAP.  ROV can be with me or NP.

## 2016-11-18 NOTE — Telephone Encounter (Signed)
Patient returned phone call..ert ° °

## 2016-11-18 NOTE — Telephone Encounter (Signed)
Called and spoke with pt and she is aware of recs from VS.  [facility] and that they will contact her to set this up.  Nothing further is needed.

## 2016-11-18 NOTE — Telephone Encounter (Signed)
LM x 1 

## 2016-12-01 ENCOUNTER — Telehealth: Payer: Self-pay | Admitting: Pulmonary Disease

## 2016-12-01 NOTE — Telephone Encounter (Signed)
[facility] is processing order.  Nothing further needed.

## 2016-12-01 NOTE — Telephone Encounter (Signed)
[facility] and [facility] for her.  If need to reach her, CB is [phone].

## 2016-12-07 ENCOUNTER — Telehealth: Payer: Self-pay | Admitting: Pulmonary Disease

## 2016-12-07 DIAGNOSIS — G4733 Obstructive sleep apnea (adult) (pediatric): Secondary | ICD-10-CM

## 2016-12-07 NOTE — Telephone Encounter (Signed)
[facility] will call back in the morning.

## 2016-12-08 NOTE — Telephone Encounter (Signed)
Okay to send order. 

## 2016-12-08 NOTE — Telephone Encounter (Signed)
VS please advise on Melissas questions:    Pt was given machine by another company in 2015---this was not auto capable.  [facility] can do a 2 week auto titration loaner so that they can figure out a set pressure for her, if you would like, but they will need a new order for this.  Please advise. Thanks

## 2016-12-08 NOTE — Telephone Encounter (Signed)
Order has been placed. Nothing further was needed. 

## 2017-03-27 ENCOUNTER — Telehealth: Payer: Self-pay | Admitting: Pulmonary Disease

## 2017-03-27 NOTE — Telephone Encounter (Signed)
Auto CPAP 02/06/17 to 03/07/17 >> used on 21 of 30 nights with average 5 hrs 22 min.  Average AHI 0.6 with median CPAP 8 cm and 95 th percentile CPAP 11 cm H2O   Will have my nurse inform pt that CPAP download shows good control of sleep apnea.  She needs ROV with me or NP to further assess status of sleep apnea therapy.

## 2017-03-27 NOTE — Telephone Encounter (Signed)
LVM for patient to return call back regarding results of CPAP download.

## 2017-03-31 NOTE — Telephone Encounter (Signed)
LVM for patient to return call back regarding results of CPAP download this week.

## 2017-04-03 NOTE — Telephone Encounter (Signed)
Called and spoke with patient to informed her of results and recommendations per SV. The pt verbalized understanding and denied concerns or questions. Pt will call back for follow up appt.

## 2017-04-29 IMAGING — RF DG UGI W/ KUB
15 of 20 series · 17 of 24 positions shown · non-contrast
Comparison: Upper GI examinations of 06/16/2014, 01/04/2012,
11/23/2009, and 10/01/2009.

CLINICAL DATA: Lap band, very little weight loss, feeling of reflux

EXAM:
UPPER GI SERIES WITH KUB
TECHNIQUE: After obtaining a scout radiograph a routine upper GI series was
performed using thin barium
FLUOROSCOPY TIME:  Radiation Exposure Index (as provided by the
fluoroscopic device): 293 Gy per sq cm
If the device does not provide the exposure index:
Fluoroscopy Time (in minutes and seconds):  1 minutes 24 seconds
Number of Acquired Images:

[Series 1: run · 1 of 7 slices shown (1 of 14)]
[im 1/7]
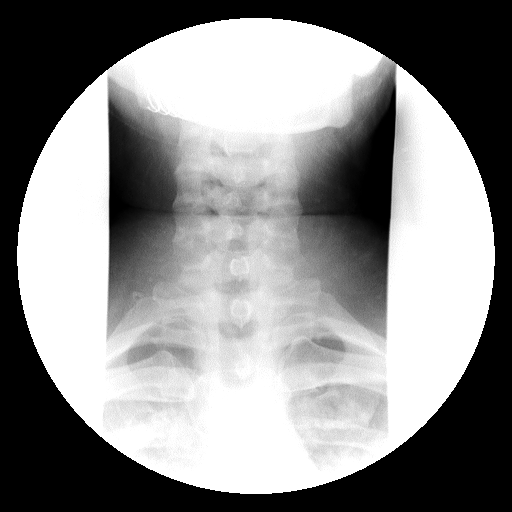

[Series 2: run · 3 of 11 slices shown (2 of 14)]
[im 1/11]
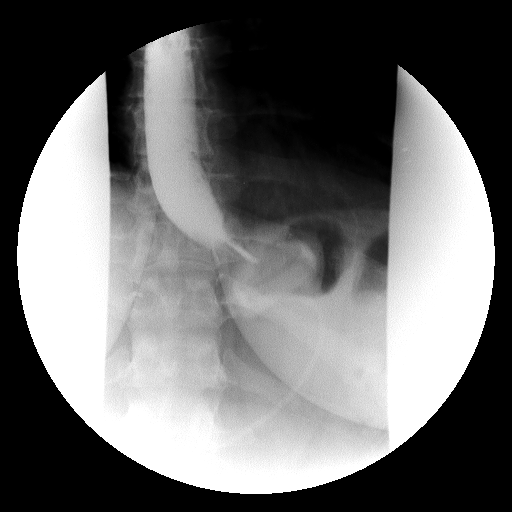
[im 4/11]
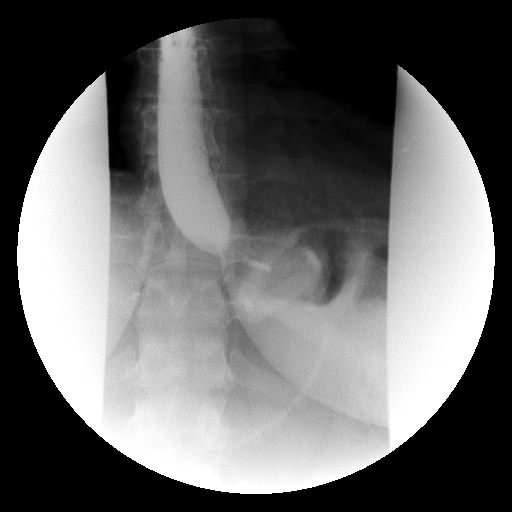
[im 7/11]
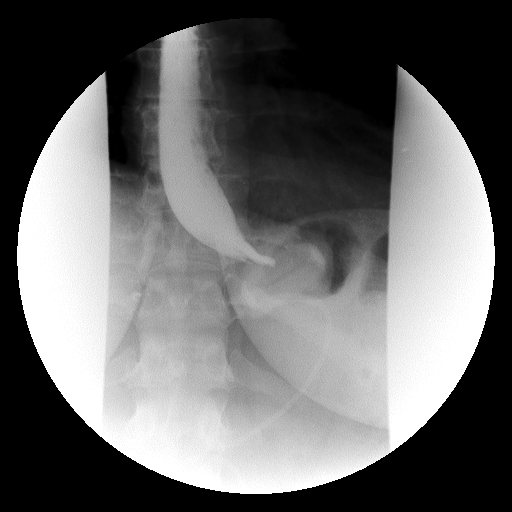

[Series 3: run · 1 of 1 slices shown (3 of 14)]
[im 1/1]
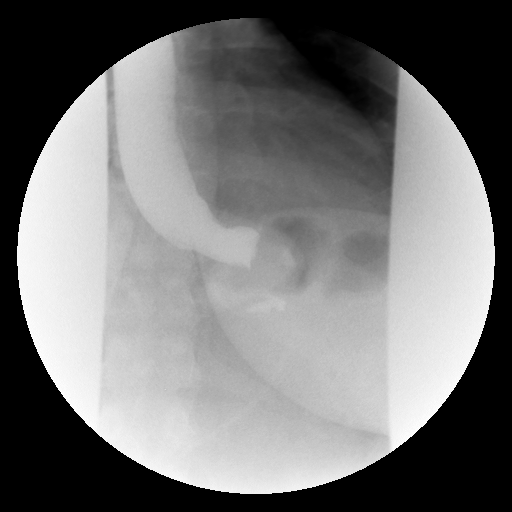

[Series 4: run · 1 of 1 slices shown (4 of 14)]
[im 1/1]
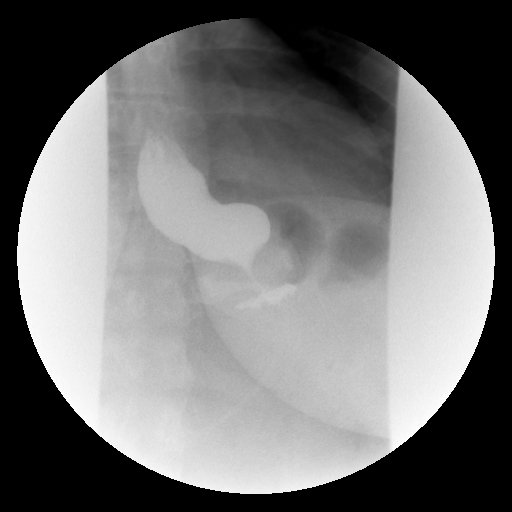

[Series 6: run · 1 of 1 slices shown (5 of 14)]
[im 1/1]
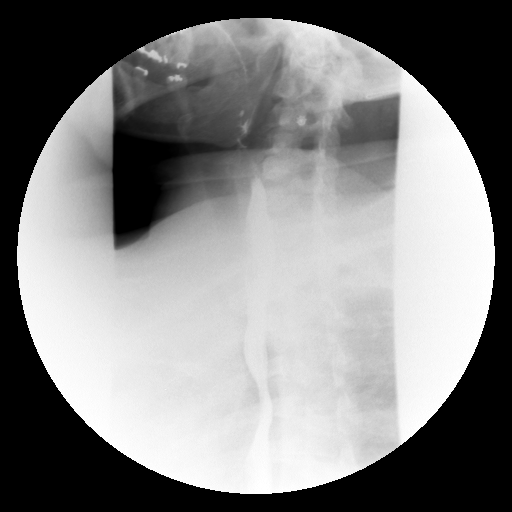

[Series 7: run · 1 of 1 slices shown (6 of 14)]
[im 1/1]
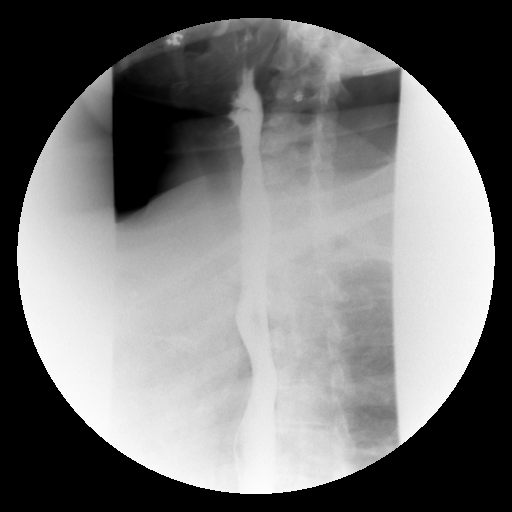

[Series 9: run · 1 of 1 slices shown (7 of 14)]
[im 1/1]
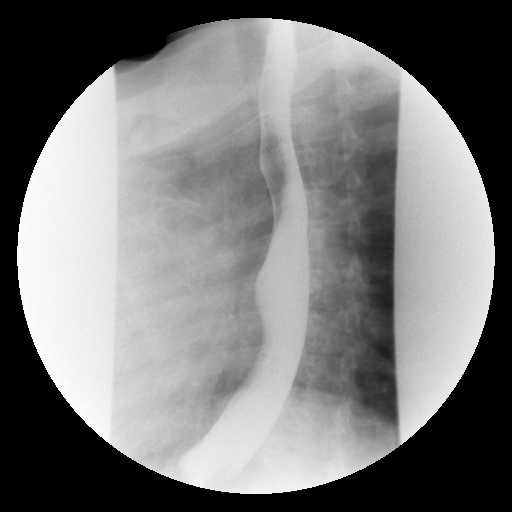

[Series 10: run · 1 of 1 slices shown (8 of 14)]
[im 1/1]
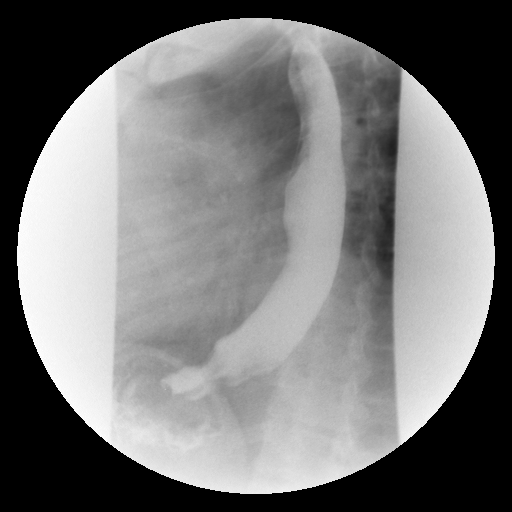

[Series 11: run · 1 of 1 slices shown (9 of 14)]
[im 1/1]
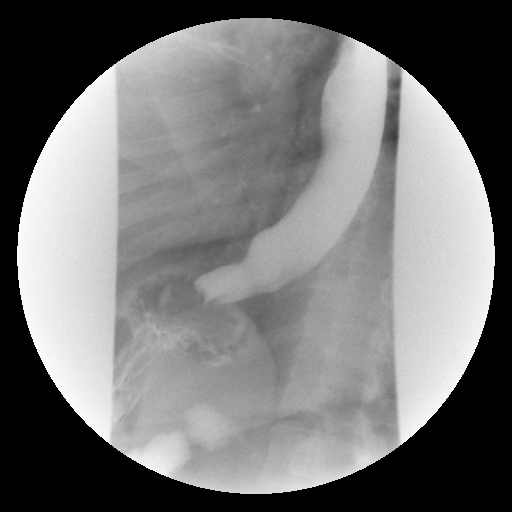

[Series 13: run · 1 of 1 slices shown (10 of 14)]
[im 1/1]
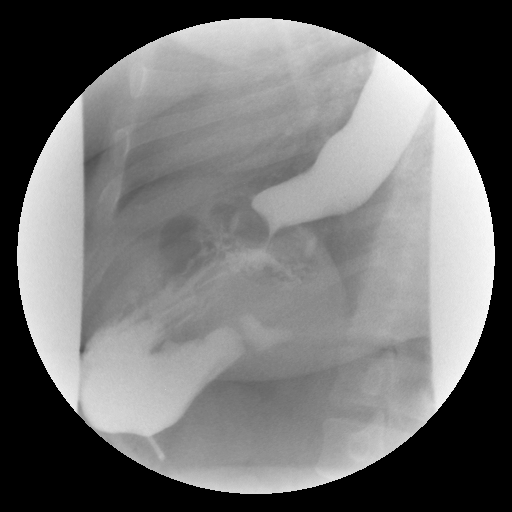

[Series 14: run · 1 of 1 slices shown (11 of 14)]
[im 1/1]
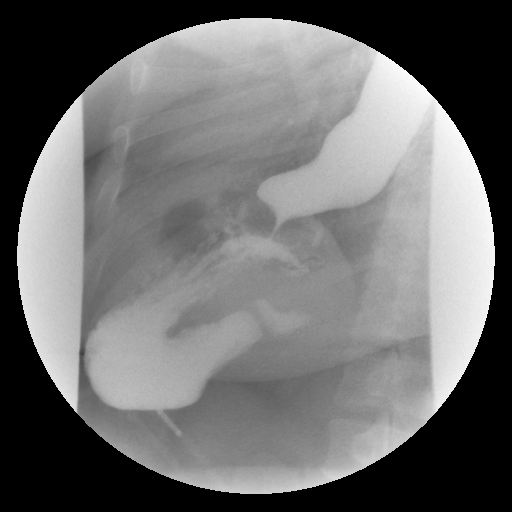

[Series 16: run · 1 of 1 slices shown (12 of 14)]
[im 1/1]
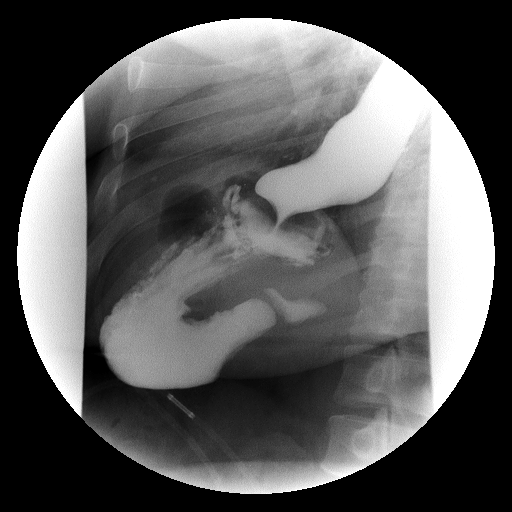

[Series 17: run · 1 of 1 slices shown (13 of 14)]
[im 1/1]
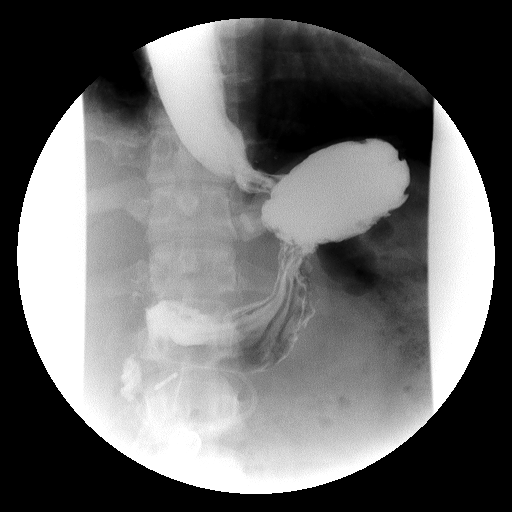

[Series 18: run · 1 of 1 slices shown (14 of 14)]
[im 1/1]
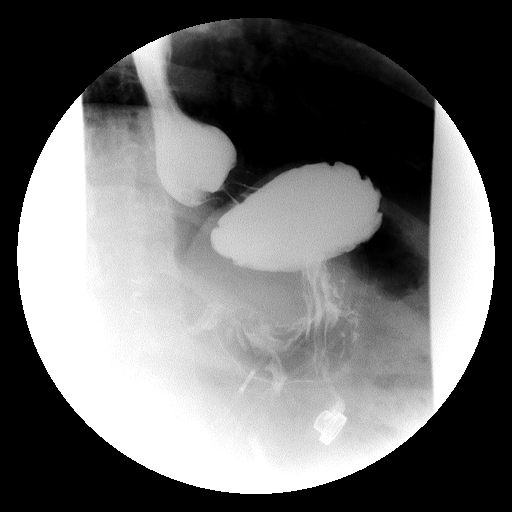

[Series 1001: view not recorded · 0.20mm/px · 1 of 1 slices shown]
[im 1/1]
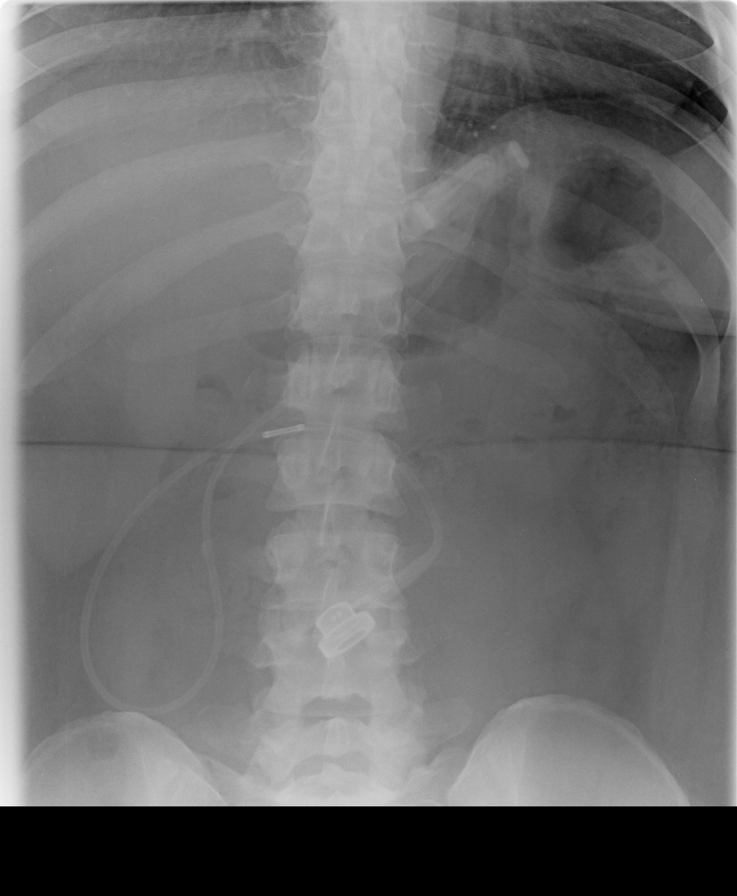

[17 of 24 positions shown; findings below may reference images not displayed]

FINDINGS: A preliminary film of the abdomen shows no apparent change in
position of the lap band with a normal phi angle. The bowel gas
pattern is nonspecific.

Initially rapid sequence spot films of the cervical esophagus were
performed showing a normal swallowing mechanism. Rapid sequence
films in the erect position were obtained over the lap band showing
the lap band to be somewhat tight with very little barium coursing
through the lap band in the erect position. The patient was then
placed in the RAO position on the table and esophageal peristalsis
appears normal. However there is again delayed passage of barium
through the lap band with a somewhat tight lumen. This delay in
passage of barium through the lap band results in distention of the
esophagus by barium. The stomach is not optimally distended but no
significant abnormality is seen. The duodenal bulb fills normally.
IMPRESSION: 1. Somewhat tight lap band with delayed passage of barium through
the lap band into the stomach, and resultant distention of the
esophagus.
2. No gross abnormality of the stomach or duodenum.

## 2018-08-06 ENCOUNTER — Telehealth: Payer: Self-pay | Admitting: Pulmonary Disease

## 2018-08-06 NOTE — Telephone Encounter (Signed)
Patient last seen 10/2016 by VS, HST was ordered and CPAP subsequently ordered Patient never seen in the office for new CPAP follow up This is likely why insurance is denying her CPAP supplies  Called spoke with patient to discuss Patient also noted that [facility] states that she is non-compliant, with only 17% usage Patient is enrolled in [employer]  Appt scheduled with VS for 2.5.2020 @ 1545 for CPAP follow up  Nothing further needed; will sign off

## 2018-08-08 ENCOUNTER — Ambulatory Visit: Payer: BC Managed Care – PPO | Admitting: Pulmonary Disease

## 2018-08-08 ENCOUNTER — Encounter: Payer: Self-pay | Admitting: Pulmonary Disease

## 2018-08-08 VITALS — BP 124/76 | HR 90 | Ht 72.0 in | Wt 372.0 lb

## 2018-08-08 DIAGNOSIS — G4700 Insomnia, unspecified: Secondary | ICD-10-CM | POA: Diagnosis not present

## 2018-08-08 DIAGNOSIS — G4730 Sleep apnea, unspecified: Secondary | ICD-10-CM

## 2018-08-08 DIAGNOSIS — Z9989 Dependence on other enabling machines and devices: Secondary | ICD-10-CM

## 2018-08-08 DIAGNOSIS — G4733 Obstructive sleep apnea (adult) (pediatric): Secondary | ICD-10-CM | POA: Diagnosis not present

## 2018-08-08 DIAGNOSIS — F5112 Insufficient sleep syndrome: Secondary | ICD-10-CM | POA: Diagnosis not present

## 2018-08-08 DIAGNOSIS — E669 Obesity, unspecified: Secondary | ICD-10-CM

## 2018-08-08 NOTE — Patient Instructions (Signed)
Email with dose of trazodone you are taking  Will arrange for new CPAP mask and supplies  Will call with results of CPAP download  Follow up in 1 year

## 2018-08-08 NOTE — Progress Notes (Signed)
[facility]  Chief Complaint  Patient presents with  . Follow-up    follows for OSA    Constitutional:  BP 124/76 (BP Location: Right Arm, Cuff Size: Normal)   Pulse 90   Ht 6' (1.829 m)   Wt (!) 372 lb (168.7 kg)   SpO2 99%   BMI 50.45 kg/m   Past Medical History:  Asthma, GERD, Anemia, Arthritis  Brief Summary:  Kayla Russo is a 43 y.o. female with obstructive sleep apnea.  Last seen in 2018.  She has been using auto CPAP.  Hasn't received new supplies for a while.  Was told she needed follow up first.  Mask has been leaking.  Has trouble falling asleep and staying asleep.  Prescribed trazodone.  This helps, but she has hangover effect.  Goes to bed at 1130 pm. Wakes up at 615 am.  Feels tired.  She is working and goes to school.  Not much free time.  Doesn't get home until 9 pm.  Only day off is on Sunday.  She is followed at [facility] for weight management.  Weight is back up after she went off some of her medications.   Physical Exam:   Appearance - well kempt   ENMT - clear nasal mucosa, midline nasal  septum, no oral exudates, no LAN, trachea midline  Respiratory - normal chest wall, normal respiratory effort, no accessory muscle use, no wheeze/rales  CV - s1s2 regular rate and rhythm, no murmurs, no peripheral edema, radial pulses symmetric  GI - soft, non tender, no masses  Lymph - no adenopathy noted in neck and axillary areas  MSK - normal gait  Ext - no cyanosis, clubbing, or joint inflammation noted  Skin - no rashes, lesions, or ulcers  Neuro - normal strength, oriented x 3  Psych - normal mood and affect  Assessment/Plan:   Obstructive sleep apnea. - will continue auto CPAP and get download - will arrange for new CPAP supplies  Insufficient sleep. - discussed options to try and allow more time for sleep  Insomnia with sleep apnea. - discussed adjusting time for when she takes trazodone to avoid  hangover effect  Morbid obesity. - she is followed at weight management center with [facility]   Patient Instructions  Email with dose of trazodone you are taking  Will arrange for new CPAP mask and supplies  Will call with results of CPAP download  Follow up in 1 year  A total of  27 minutes were spent face to face with the patient and more than half of that time involved counseling or coordination of care.   Coralyn Helling, MD [place] [facility] Pager: [phone] 08/08/2018, 4:26 PM  Flow Sheet    Sleep tests:  PSG 2010 >> AHI 92 BiPAP 07/21/15 to 08/20/15 >> used on 25 of 31 nights with average 5 hrs 16 min.  Average AHI 4.5 with BiPAP 13/9 cm H2O HST 11/01/16 >> AHI 6.4, SaO2 low 93% Auto CPAP 02/06/17 to 03/07/17 >> used on 21 of 30 nights with average 5 hrs 22 min.  Average AHI 0.6 with median CPAP 8 cm and 95 th percentile CPAP 11 cm H2O  Medications:   Allergies as of 08/08/2018      Reactions   Amoxicillin    Rash around mouth       Medication List       Accurate as of August 08, 2018  4:26 PM. Always use your most recent  med list.        albuterol 108 (90 Base) MCG/ACT inhaler Commonly known as:  PROVENTIL HFA;VENTOLIN HFA Inhale 2 puffs into the lungs every 6 (six) hours as needed for wheezing.   Biotin 1 MG Caps Take by mouth.   CALCIUM CITRATE + PO Take 3,000 mg by mouth daily.   MULTIVITAMIN PO Take 2 tablets by mouth daily.   pantoprazole 40 MG tablet Commonly known as:  PROTONIX Take by mouth.   TRINTELLIX 5 MG Tabs tablet Generic drug:  vortioxetine HBr Take 15 mg by mouth daily.   Vitamin B-12 5000 MCG Tbdp Take 1 tablet by mouth once a week.   WELLBUTRIN XL 300 MG 24 hr tablet Generic drug:  buPROPion Take 300 mg by mouth daily.       Past Surgical History:  She  has a past surgical history that includes Laparoscopic gastric banding (may 17th, 2011) and Bunionectomy (2007/2008).  Family History:  Her family history  includes Asthma in her brother and mother; Cancer in her mother; Heart disease in her brother, mother, and mother.  Social History:  She  reports that she has never smoked. She has never used smokeless tobacco. She reports current alcohol use. She reports that she does not use drugs.
